# Patient Record
Sex: Female | Born: 1975 | Hispanic: Yes | Marital: Married | Smoking: Never smoker
Health system: Southern US, Community
[De-identification: ages and names within clinical notes are randomized; demographics above are authoritative.]

## PROBLEM LIST (undated history)

## (undated) DIAGNOSIS — E782 Mixed hyperlipidemia: Secondary | ICD-10-CM

## (undated) DIAGNOSIS — S83242A Other tear of medial meniscus, current injury, left knee, initial encounter: Secondary | ICD-10-CM

## (undated) DIAGNOSIS — E119 Type 2 diabetes mellitus without complications: Secondary | ICD-10-CM

## (undated) DIAGNOSIS — R1013 Epigastric pain: Secondary | ICD-10-CM

## (undated) DIAGNOSIS — I10 Essential (primary) hypertension: Secondary | ICD-10-CM

---

## 2017-10-08 ENCOUNTER — Other Ambulatory Visit: Payer: Self-pay

## 2017-10-08 ENCOUNTER — Emergency Department: Payer: Worker's Compensation

## 2017-10-08 ENCOUNTER — Emergency Department
Admission: EM | Admit: 2017-10-08 | Discharge: 2017-10-08 | Disposition: A | Discharge status: Home / Self Care | Payer: Worker's Compensation | Attending: Emergency Medicine | Admitting: Emergency Medicine

## 2017-10-08 ENCOUNTER — Encounter: Payer: Self-pay | Admitting: Emergency Medicine

## 2017-10-08 DIAGNOSIS — Y9289 Other specified places as the place of occurrence of the external cause: Secondary | ICD-10-CM | POA: Diagnosis not present

## 2017-10-08 DIAGNOSIS — S3992XA Unspecified injury of lower back, initial encounter: Secondary | ICD-10-CM | POA: Diagnosis present

## 2017-10-08 DIAGNOSIS — S300XXA Contusion of lower back and pelvis, initial encounter: Secondary | ICD-10-CM | POA: Diagnosis not present

## 2017-10-08 DIAGNOSIS — Y990 Civilian activity done for income or pay: Secondary | ICD-10-CM | POA: Insufficient documentation

## 2017-10-08 DIAGNOSIS — Y9389 Activity, other specified: Secondary | ICD-10-CM | POA: Insufficient documentation

## 2017-10-08 DIAGNOSIS — W228XXA Striking against or struck by other objects, initial encounter: Secondary | ICD-10-CM | POA: Diagnosis not present

## 2017-10-08 LAB — URINALYSIS, COMPLETE (UACMP) WITH MICROSCOPIC
BILIRUBIN URINE: NEGATIVE
Bacteria, UA: NONE SEEN
GLUCOSE, UA: NEGATIVE mg/dL
KETONES UR: NEGATIVE mg/dL
LEUKOCYTES UA: NEGATIVE
NITRITE: NEGATIVE
PH: 5 (ref 5.0–8.0)
Protein, ur: NEGATIVE mg/dL
SPECIFIC GRAVITY, URINE: 1.018 (ref 1.005–1.030)

## 2017-10-08 LAB — POCT PREGNANCY, URINE: PREG TEST UR: NEGATIVE

## 2017-10-08 MED ORDER — KETOROLAC TROMETHAMINE 30 MG/ML IJ SOLN
30.0000 mg | Freq: Once | INTRAMUSCULAR | Status: AC
  Administered 2017-10-08: 30 mg via INTRAMUSCULAR
  Filled 2017-10-08: qty 1

## 2017-10-08 MED ORDER — NAPROXEN 500 MG PO TABS: 500.0000 mg | ORAL_TABLET | Freq: Two times a day (BID) | ORAL | 0 refills | Status: DC

## 2017-10-08 NOTE — ED Provider Notes (Signed)
[facility] Emergency Department Provider Note  ___________________________________________   First MD Initiated Contact with Patient 10/08/17 [phone]     (approximate)  I have reviewed the triage vital signs and the nursing notes.   HISTORY Spanish interpreter [facility]  Chief Complaint Back Pain  HPI Vickie Walker is a 42 y.o. female is here with a back injury that occurred at work around noon on 10/07/17.  Patient states that she was working at a table when an aluminum radiator fell from behind her and struck her in the back.  This forced her abdomen into the table in front of her.  She was able to get out on her own and was not pinned in.  Patient has continued to have back pain especially when she is standing and walking.  She has taken Tylenol but the last dose being at 5 PM yesterday.  She denies any urinary symptoms.  She is complaining of some numbness in both legs.  She rates her pain as an 8 out of 10.   History reviewed. No pertinent past medical history.  There are no active problems to display for this patient.   History reviewed. No pertinent surgical history.  Prior to Admission medications   Medication Sig Start Date End Date Taking? Authorizing Provider  naproxen (NAPROSYN) 500 MG tablet Take 1 tablet (500 mg total) by mouth 2 (two) times daily with a meal. 10/08/17   Johnn Hai, PA-C    Allergies Patient has no known allergies.  No family history on file.  Social History Social History   Tobacco Use  . Smoking status: Not on file  Substance Use Topics  . Alcohol use: Not on file  . Drug use: Not on file    Review of Systems Constitutional: No fever/chills Cardiovascular: Denies chest pain. Respiratory: Denies shortness of breath. Gastrointestinal: No abdominal pain.  No nausea, no vomiting. Genitourinary: Negative for dysuria.  Negative for hematuria. Musculoskeletal: Positive for low back pain. Skin: Negative  for rash. Neurological: Negative for headaches, focal weakness or numbness. ____________________________________________   PHYSICAL EXAM:  VITAL SIGNS: ED Triage Vitals  Enc Vitals Group     BP 10/08/17 0700 (!) 155/102     Pulse Rate 10/08/17 0700 77     Resp 10/08/17 0700 18     Temp 10/08/17 0700 97.8 F (36.6 C)     Temp Source 10/08/17 0700 Oral     SpO2 10/08/17 0700 97 %     Weight 10/08/17 0649 148 lb (67.1 kg)     Height 10/08/17 0649 4' 5"  (1.346 m)     Head Circumference --      Peak Flow --      Pain Score 10/08/17 0648 8     Pain Loc --      Pain Edu? --      Excl. in [place]? --    Constitutional: Alert and oriented. Well appearing and in no acute distress. Eyes: Conjunctivae are normal.  Head: Atraumatic. Neck: No stridor.   Cardiovascular: Normal rate, regular rhythm. Grossly normal heart sounds.  Good peripheral circulation. Respiratory: Normal respiratory effort.  No retractions. Lungs CTAB. Gastrointestinal: Soft and nontender. No distention.  Musculoskeletal: On examination of the back there is some tenderness on palpation of the vertebral bodies L5-S1 area and paravertebral muscles surrounding.  There is no ecchymosis or abrasions seen from her injury.  Range of motion is minimally restricted as patient acted out the injury that occurred while  at work.  Patient also showed both myself, nurse and interpreter the amount of bending that she does not work.  Straight leg raises were negative.  Normal gait noted. Neurologic:  Normal speech and language. No gross focal neurologic deficits are appreciated.  Reflexes were 2+ bilaterally.  No gait instability. Skin:  Skin is warm, dry and intact.  No ecchymosis, abrasions, erythema present. Psychiatric: Mood and affect are normal. Speech and behavior are normal.  ____________________________________________   LABS (all labs ordered are listed, but only abnormal results are displayed)  Labs Reviewed  URINALYSIS,  COMPLETE (UACMP) WITH MICROSCOPIC - Abnormal; Notable for the following components:      Result Value   Color, Urine YELLOW (*)    APPearance HAZY (*)    Hgb urine dipstick SMALL (*)    Squamous Epithelial / LPF 0-5 (*)    All other components within normal limits  POCT PREGNANCY, URINE  POC URINE PREG, ED    RADIOLOGY  ED MD interpretation:   Lumbar spine is without acute changes.  Official radiology report(s): Dg Lumbar Spine 2-3 Views  Result Date: 10/08/2017 CLINICAL DATA:  Pain after trauma EXAM: LUMBAR SPINE - 2-3 VIEW COMPARISON:  None. FINDINGS: An IUD is seen in the pelvis.  Soft tissues otherwise unremarkable. No inter pedicular widening or significant malalignment. No evidence of fracture. Mild multilevel degenerative disc disease. IMPRESSION: No fractures identified. Electronically Signed   By: Dorise Bullion III M.D   On: 10/08/2017 07:58    ____________________________________________   PROCEDURES  Procedure(s) performed: None  Procedures  Critical Care performed: No  ____________________________________________   INITIAL IMPRESSION / ASSESSMENT AND PLAN / ED COURSE Interpreter was used to explain to the patient that most likely this is a contusion to her back and that x-ray findings did show some degenerative changes which the patient was unaware of.  Patient was given a injection of Toradol 30 mg IM.  She was discharged with a prescription for naproxen 500 mg twice daily with food.  Patient is to follow-up with company physician or to [facility] acute care if any continued problems.  Patient was given return to work note with multiple restrictions that she will return with to her company today.  ____________________________________________   FINAL CLINICAL IMPRESSION(S) / ED DIAGNOSES  Final diagnoses:  Contusion of lower back, initial encounter     ED Discharge Orders        Ordered    naproxen (NAPROSYN) 500 MG tablet  2 times daily with  meals     10/08/17 0824       Note:  This document was prepared using Dragon voice recognition software and may include unintentional dictation errors.    Johnn Hai, PA-C 10/08/17 1023    Schaevitz, Randall An, MD 10/08/17 1037

## 2017-10-08 NOTE — Discharge Instructions (Signed)
Follow up with your company's doctor or [facility] acute care if any continued problems with your back.  Take medication only as directed.

## 2017-10-08 NOTE — ED Notes (Signed)
Pt states she was at work standing at a table yesterday when a radiator on the table behind her fell off and struck her in the lower back causing her to push into the table in front of her. Pt c/o lower back pain. Pt ambulatory to ED41 from the [place] without difficulty. No noted eccymosis at present. No neuro deficits noted.

## 2017-10-08 NOTE — ED Triage Notes (Addendum)
[employer] [employer] [phone] pt reports while at work yesterday she was hit by an object that is an aluminum radiator. The object hit her in the lower back. Pt reports she was standing against a table and the object pushed her into the table so she is also having some lower abd pain from hitting the table. Pt denies difficulty urinating since and denies blood in her urine.

## 2019-05-15 ENCOUNTER — Other Ambulatory Visit: Payer: Self-pay

## 2019-05-15 ENCOUNTER — Ambulatory Visit: Payer: Self-pay

## 2019-05-15 VITALS — BP 110/76 | HR 71 | Resp 16 | Ht <= 58 in | Wt 137.0 lb

## 2019-05-15 DIAGNOSIS — Z008 Encounter for other general examination: Secondary | ICD-10-CM

## 2019-05-15 DIAGNOSIS — Z23 Encounter for immunization: Secondary | ICD-10-CM

## 2019-05-15 LAB — POCT LIPID PANEL
Glucose Fasting, POC: 134 mg/dL — AB (ref 70–99)
HDL: 42
LDL: 79
Non-HDL: 121
TC/HDL: 3.9
TC: 163
TRG: 212

## 2019-05-15 NOTE — Patient Instructions (Signed)
Vacuna antigripal (inactivada o recombinante): lo que debe saber Influenza (Flu) Vaccine (Inactivated or Recombinant): What You Need to Know 1. Por qu vacunarse? La vacuna contra la gripe puede prevenir la gripe. La gripe es una enfermedad contagiosa que se disemina en los [place] cada ao, por lo general, Lyndal Pulley octubre y Herman. Cualquier persona puede contraer gripe, pero es ms peligrosa para Education administrator. Los bebs y los nios pequeos, los Nordstrom de 30aos, las [place], as [employer] personas que tienen ciertas enfermedades o cuyo sistema inmunitario est debilitado corren un riesgo mayor de tener complicaciones debido a la gripe. La neumona, la bronquitis, las infecciones de los senos paranasales y las infecciones de odos son ejemplos de complicaciones relacionadas con la gripe. Si tiene una afeccin, por ejemplo, enfermedad cardaca, cncer o diabetes, la gripe puede empeorarla. La gripe puede causar fiebre y escalofros, dolor de garganta, dolores musculares, fatiga, tos, dolor de [place] y secrecin o congestin nasal. Algunas personas pueden tener vmitos y [place], [place] esto es ms frecuente en los nios que en los adultos. Cada ao, miles de [employer] [place] debido a la gripe, y muchas ms deben ser hospitalizadas. Cada ao, la vacuna antigripal previene millones de enfermedades y evita visitas al mdico relacionadas con la gripe. 2. Edward Jolly contra la gripe Los CDC (Centros para el Control y la Prevencin de [place]) recomiendan que todas las personas a Proofreader de los 6 meses de edad se vacunen cada temporada de gripe. Es posible que los nios de 43mses a 8aos deban recibir 2 dosis durante la misma temporada de gripe. Todas las dems personas tienen que aplicarse 1 sola dosis cada temporada de gripe. La vacuna comienza a surtir eAdministrator, Civil Service2semanas despus de su aplicacin. Hay muchos virus de la gripe, y eInformation systems manager  Cada ao, se elabora una nueva vacuna antigripal para brindar proteccin contra tres o cuatro virus que probablemente causen la enfermedad en la siguiente temporada de gripe. Incluso si la vacuna no es especfica para esos virus, aun as puede brindar cierta proteccin. La vacuna contra la gripe no causa gripe. La vacuna contra la gripe puede administrarse al mismo tiempo que otras vacunas. 3. Hable con el mdico Comunquese con la persona que le coloca las vacunas si la persona que la recibe:  Ha tenido una reaccin alrgica despus de uArdelia Memsdosis previa de la vacuna contra la gripe o tiene alguna alergia grave, potencialmente mortal.  Alguna vez tuvo sndrome de Guillain-Barr (tambin llamado SGB). En algunos casos, es posible que el mdico decida posponer la aplicacin de la vacuna contra la gripe para una visita en el futuro. Las personas que sufren trastornos menores, como un resfro, pueden vacunarse. Las pIllinois Tool Workstienen enfermedades moderadas o graves generalmente deben esperar hasta recuperarse para poder recibir la vacuna contra la gripe. Su mdico puede darle ms informacin. 4. Riesgos de uMexicoreaccin a la vacuna  Despus de recibir la vacuna contra la gripe, pAutomotive engineer enrojecimiento e hEstate agentde la inyeccin, fiebre, dolores musculares y dSocial research officer, governmentde cN[place]  Puede haber un pequeo aumento del riesgo de sufrir sndrome de GCurator(SGB) despus de la aplicacin de la vacuna contra la gripe inactivada. Los nios pequeos que reciben la vacuna antigripal junto con la vacuna antineumoccica (PCV13), o la DTaP en el mismo momento pueden tener una probabilidad un poco ms elevada de tener una convulsin debido a la fiebre. Informe al mdico si un nio que est recibiendo la  vacuna antigripal ha tenido una convulsin [employer]idental Petroleum. Las personas a veces se desmayan despus de procedimientos mdicos, incluida la vacunacin. Informe al mdico si se siente mareado, tiene  cambios en la visin o zumbidos en los odos. Al igual que con cualquier [employer] Company, existe una probabilidad muy remota de que una vacuna cause una reaccin alrgica grave, otra lesin grave o la muerte. 5. Qu pasa si se presenta un problema grave? Podra producirse una reaccin alrgica despus de que la persona vacunada abandone la clnica. Si observa signos de Nurse, mental health grave (ronchas, hinchazn de la cara y la garganta, dificultad para respirar, latidos cardacos acelerados, mareos o debilidad), llame al 9-1-1 y lleve a la persona al hospital ms cercano. Si se presentan otros signos que le preocupan, comunquese con su mdico. Las reacciones adversas deben informarse al Sistema de Informe de Eventos Adversos de Clinical biochemist (Vaccine Adverse Event Reporting System, VAERS). Por lo general, el mdico presenta este informe o puede hacerlo usted mismo. Visite el sitio web del VAERS en [email or web address].es o llame al [phone].El VAERS es solo para Electrical engineer; su personal no proporciona asesoramiento mdico. 6. Programa Nacional de Compensacin de Daos por [place] de Compensacin de Daos por Clinical biochemist (National Vaccine Injury Fiserv, Runner, broadcasting/film/video) es un programa federal que fue creado para Patent examiner a las personas que puedan haber sufrido daos al recibir ciertas vacunas. Visite el sitio web del VICP en GoldCloset.com.ee o llame al 1-581-456-7292 para obtener ms informacin acerca del programa y de cmo presentar un reclamo. Hay un lmite de tiempo para presentar un reclamo de compensacin. 7. Cmo puedo obtener ms informacin?  Pregntele al mdico.  Comunquese con el servicio de salud de su localidad o su estado.  Comunquese con los Centros para el Control y la Prevencin de Probation officer for Disease Control and Prevention, CDC): ? Llame al (719)[phone] (1-800-CDC-INFO) o ? Visite el sitio Biomedical engineer en https://gibson.com/  Declaracin de informacin (provisional) sobre la vacuna contra la gripe inactivada (15/03/2018) Esta informacin no tiene Marine scientist el consejo del mdico. Asegrese de hacerle al mdico cualquier pregunta que tenga. Document Released: 10/29/2008 Document Revised: 04/11/2018 Document Reviewed: 04/11/2018 Elsevier Patient Education  2020 [place] gripe en los adultos Preventing Influenza, Adult La gripe es una infeccin viral que afecta, principalmente, las vas respiratorias. Las vas respiratorias incluyen las estructuras que lo ayudan a Ambulance person, [employer], la nariz y Patent examiner. La gripe provoca muchos sntomas del resfro comn, as como fiebre alta y Hydrologist. Se transmite fcilmente de persona a persona (es contagiosa). La gripe es ms frecuente de diciembre a Administrator, arts. Este perodo se llama temporada de gripe. Puede contraer el virus de la gripe en las siguientes circunstancias:  Al aspirar las gotitas que una persona infectada elimina al toser o Brewing technologist.  Al tocar algo que fue recientemente contaminado con el virus y Dow Chemical mano a la boca, la nariz o los ojos. Qu puedo hacer para disminuir el riesgo?        Es posible International aid/development worker de contraer gripe tomando las siguientes medidas:  Vacunarse contra la gripe(vacunacin antigripal) todos los [place]. Esta es la mejor forma de prevenir la gripe. Se recomienda que todos las personas de 77mses o ms se vacunen contra la gripe. ? Es mejor vacunarse en el otoo, tan pronto como la vacuna est disponible. De todos modos, vacunarse durante el invierno o la primavera tambin es bueno. La  temporada de gripe puede durar hasta principios de la primavera. ? Para prevenir la gripe mediante vacunacin es necesario vacunarse todos los [place]. La razn es que el virus de la gripe cambia levemente(muta) de ao a ao. Aunque la vacuna antigripal no lo proteja completamente contra todas las  mutaciones del virus, puede disminuir la gravedad de la enfermedad y prevenir complicaciones peligrosas de la gripe. ? Si est embarazada, usted puede y debe aplicarse la vacuna contra la gripe. ? Si ha tenido Engineer, petroleum reaccin a Radiographer, therapeutic pasado o si es alrgico a los huevos, consulte al mdico antes de aplicarse la vacuna contra la gripe. ? Algunas veces, se puede conseguir la vacuna en la forma de aerosol nasal. Algunos aos el aerosol nasal no fue tan efectivo contra el virus de la gripe. Consulte con su mdico si tiene preguntas sobre esto.  Tener hbitos saludables. Esto es muy importante durante la temporada de gripe. ? Evite el contacto con personas que tengan sntomas de resfro o gripe. ? Lvese las manos frecuentemente con agua y [place]. Use desinfectante para manos con alcohol si no dispone de [place] y [place]. ? Evite tocarse la cara con las manos; sobre todo, cuando no se las haya lavado recientemente. ? Use un desinfectante para limpiar las superficies en el hogar y en el trabajo que pueden estar contaminadas con el virus de la gripe. ? Mantenga el sistema del cuerpo que lucha contra las enfermedades (sistema inmunitario) en buen estado siguiendo una dieta saludable, bebiendo mucho lquido, durmiendo lo suficiente y realizando actividad fsica con regularidad. Si contrae gripe, evite contagiar a otros haciendo lo siguiente:  No salga de su casa hasta que los sntomas hayan desaparecido, al menos, durante un da.  Al toser o estornudar, cbrase la boca y la [place].  Evite el contacto cercano con otras personas; especialmente, con bebs y Special educational needs teacher. Por qu son importantes estos cambios? La vacunacin contra la gripe y la prctica de hbitos saludables lo protegen a usted y a Lobbyist. Si se enferma de gripe, sus amigos, familiares y colegas tambin tienen riesgo de enfermarse porque se contagia muy fcilmente de unos a otros. Cada ao, alrededor Merck & Co de cada diez personas  contraen gripe. Es posible que la gripe cause complicaciones, como neumona, otitis y sinusitis. La gripe tambin puede ser mortal; especialmente, en el caso de los bebs, los [place] de 22aos y las personas con enfermedades crnicas graves. Cmo se trata? [place] personas se recuperan de la gripe haciendo reposo y bebiendo mucho lquido. Sin embargo, la prescripcin de un medicamento antiviral puede disminuir los sntomas de la gripe y Field seismologist que esta desaparezca antes. Se debe comenzar a tomar Coca[employer]a a los pocos das del inicio de los sntomas. Hable con su mdico acerca de si necesita tomar un medicamento antiviral. Pueden prescribirse medicamentos antivirales para las personas que tienen un riesgo alto de tener sntomas graves de gripe. Entre ellos se incluyen personas que:  [place] de 78 aos de edad.  Estn embarazadas.  Tienen alguna enfermedad que favorece a que la gripe empeore o sea ms peligrosa. Dnde buscar ms informacin  Centros para el control y la prevencin de Probation officer for Disease Control and Prevention, CDC): http://www.smith-bell.org/  LittleRockMedicine.com.ee: azureicus.com  [place] ([place] Academy of Family Physicians): Patent attorney.org/familydoctor/en/kids/vaccines/preventing-the-flu.html Comunquese con un mdico si:  Tiene gripe y le aparecen nuevos sntomas.  Tiene los siguientes sntomas: ? Tourist information centre manager. ?  Diarrea. ? Fiebre.  La tos empeora o tiene ms mucosidad. Resumen  La mejor manera de prevenir la gripe es vacunarse todos los aos en otoo.  Puede contraer la gripe despus de haberse colocado la vacuna anual, pero posiblemente esta sea ms leve o se cure ms rpidamente gracias a la vacuna antigripal.  Si contrae gripe, es posible aliviar los sntomas y curarse ms rpidamente si comienza a Geophysical data processor antivirales a los N[employer] Corporation del inicio de los sntomas.   Tambin puede prevenir la gripe teniendo hbitos saludables. Esta informacin no tiene Marine scientist el consejo del mdico. Asegrese de hacerle al mdico cualquier pregunta que tenga. Document Released: 08/17/2015 Document Revised: 11/12/2017 Document Reviewed: 08/17/2015 Elsevier Patient Education  2020 [employer].

## 2019-05-15 NOTE — Progress Notes (Signed)
     Patient ID: Vickie Walker, female    DOB: Aug 14, 1976, 43 y.o.   MRN: [id number]    Thank you!!  Apolonio Schneiders RN  [place] Nurse Specialist [place]: [phone]  Cell:  [phone] Website: [email or web address]

## 2019-10-30 ENCOUNTER — Emergency Department: Payer: No Typology Code available for payment source

## 2019-10-30 ENCOUNTER — Other Ambulatory Visit: Payer: Self-pay

## 2019-10-30 ENCOUNTER — Emergency Department
Admission: EM | Admit: 2019-10-30 | Discharge: 2019-10-30 | Disposition: A | Discharge status: Home / Self Care | Payer: No Typology Code available for payment source | Attending: Emergency Medicine | Admitting: Emergency Medicine

## 2019-10-30 DIAGNOSIS — M25511 Pain in right shoulder: Secondary | ICD-10-CM

## 2019-10-30 DIAGNOSIS — Z79899 Other long term (current) drug therapy: Secondary | ICD-10-CM | POA: Insufficient documentation

## 2019-10-30 DIAGNOSIS — R519 Headache, unspecified: Secondary | ICD-10-CM | POA: Diagnosis present

## 2019-10-30 DIAGNOSIS — M545 Low back pain, unspecified: Secondary | ICD-10-CM

## 2019-10-30 DIAGNOSIS — E119 Type 2 diabetes mellitus without complications: Secondary | ICD-10-CM | POA: Diagnosis not present

## 2019-10-30 HISTORY — DX: Type 2 diabetes mellitus without complications: E11.9

## 2019-10-30 LAB — BASIC METABOLIC PANEL
Anion gap: 11 (ref 5–15)
BUN: 14 mg/dL (ref 6–20)
CO2: 23 mmol/L (ref 22–32)
Calcium: 8.9 mg/dL (ref 8.9–10.3)
Chloride: 104 mmol/L (ref 98–111)
Creatinine, Ser: 0.4 mg/dL — ABNORMAL LOW (ref 0.44–1.00)
GFR calc Af Amer: >60 mL/min (ref >60)
GFR calc non Af Amer: >60 mL/min (ref >60)
Glucose, Bld: 164 mg/dL — ABNORMAL HIGH (ref 70–99)
Potassium: 3.9 mmol/L (ref 3.5–5.1)
Sodium: 138 mmol/L (ref 135–145)

## 2019-10-30 LAB — CBC
HCT: 40.1 % (ref 36.0–46.0)
Hemoglobin: 14.1 g/dL (ref 12.0–15.0)
MCH: 31 pg (ref 26.0–34.0)
MCHC: 35.2 g/dL (ref 30.0–36.0)
MCV: 88.1 fL (ref 80.0–100.0)
Platelets: 243 10*3/uL (ref 150–400)
RBC: 4.55 MIL/uL (ref 3.87–5.11)
RDW: 12.4 % (ref 11.5–15.5)
WBC: 7.6 10*3/uL (ref 4.0–10.5)
nRBC: 0 % (ref 0.0–0.2)

## 2019-10-30 LAB — POCT PREGNANCY, URINE: Preg Test, Ur: NEGATIVE

## 2019-10-30 MED ORDER — BACLOFEN 5 MG PO TABS: 5.0000 mg | ORAL_TABLET | Freq: Three times a day (TID) | ORAL | 0 refills | Status: AC | PRN

## 2019-10-30 MED ORDER — ACETAMINOPHEN 325 MG PO TABS
650.0000 mg | ORAL_TABLET | Freq: Once | ORAL | Status: AC
  Administered 2019-10-30: 650 mg via ORAL
  Filled 2019-10-30: qty 2
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
## 2020-08-26 ENCOUNTER — Other Ambulatory Visit: Payer: Self-pay | Admitting: Sports Medicine

## 2020-08-26 DIAGNOSIS — G8929 Other chronic pain: Secondary | ICD-10-CM

## 2020-08-26 DIAGNOSIS — M25562 Pain in left knee: Secondary | ICD-10-CM

## 2020-08-26 DIAGNOSIS — M25862 Other specified joint disorders, left knee: Secondary | ICD-10-CM

## 2020-09-08 ENCOUNTER — Other Ambulatory Visit: Payer: Self-pay

## 2020-09-08 ENCOUNTER — Ambulatory Visit
Admission: RE | Admit: 2020-09-08 | Discharge: 2020-09-08 | Disposition: A | Discharge status: Home / Self Care | Payer: BC Managed Care – PPO | Source: Ambulatory Visit | Attending: Sports Medicine | Admitting: Sports Medicine

## 2020-09-08 DIAGNOSIS — M25862 Other specified joint disorders, left knee: Secondary | ICD-10-CM | POA: Insufficient documentation

## 2020-09-08 DIAGNOSIS — G8929 Other chronic pain: Secondary | ICD-10-CM | POA: Insufficient documentation

## 2020-09-08 DIAGNOSIS — M25562 Pain in left knee: Secondary | ICD-10-CM | POA: Insufficient documentation

## 2020-11-04 ENCOUNTER — Emergency Department: Payer: BC Managed Care – PPO

## 2020-11-04 ENCOUNTER — Emergency Department
Admission: EM | Admit: 2020-11-04 | Discharge: 2020-11-04 | Disposition: A | Discharge status: Home / Self Care | Payer: BC Managed Care – PPO | Attending: Emergency Medicine | Admitting: Emergency Medicine

## 2020-11-04 ENCOUNTER — Other Ambulatory Visit: Payer: Self-pay

## 2020-11-04 DIAGNOSIS — Y92410 Unspecified street and highway as the place of occurrence of the external cause: Secondary | ICD-10-CM | POA: Diagnosis not present

## 2020-11-04 DIAGNOSIS — I10 Essential (primary) hypertension: Secondary | ICD-10-CM | POA: Insufficient documentation

## 2020-11-04 DIAGNOSIS — E119 Type 2 diabetes mellitus without complications: Secondary | ICD-10-CM | POA: Insufficient documentation

## 2020-11-04 DIAGNOSIS — M542 Cervicalgia: Secondary | ICD-10-CM | POA: Insufficient documentation

## 2020-11-04 DIAGNOSIS — M79642 Pain in left hand: Secondary | ICD-10-CM | POA: Insufficient documentation

## 2020-11-04 HISTORY — DX: Essential (primary) hypertension: I10

## 2020-11-04 LAB — POC URINE PREG, ED: Preg Test, Ur: NEGATIVE

## 2020-11-04 MED ORDER — IBUPROFEN 600 MG PO TABS
600.0000 mg | ORAL_TABLET | Freq: Once | ORAL | Status: AC
  Administered 2020-11-04: 600 mg via ORAL
  Filled 2020-11-04: qty 1
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
## 2020-11-04 NOTE — ED Notes (Addendum)
XR asked to come back due to patient c/o multiple pain sites and may need more imaging. Dr Beather Arbour aware.  Language line obtained and at bedside.
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
## 2020-12-25 ENCOUNTER — Other Ambulatory Visit: Payer: Self-pay | Admitting: Orthopedic Surgery

## 2020-12-25 DIAGNOSIS — M23204 Derangement of unspecified medial meniscus due to old tear or injury, left knee: Secondary | ICD-10-CM

## 2020-12-26 ENCOUNTER — Other Ambulatory Visit: Payer: Self-pay | Admitting: Orthopedic Surgery

## 2020-12-26 ENCOUNTER — Encounter: Payer: Self-pay | Admitting: Orthopedic Surgery

## 2020-12-26 ENCOUNTER — Ambulatory Visit: Payer: BC Managed Care – PPO

## 2020-12-30 ENCOUNTER — Encounter: Payer: Self-pay | Admitting: Orthopedic Surgery

## 2020-12-30 ENCOUNTER — Ambulatory Visit: Payer: BC Managed Care – PPO

## 2020-12-30 ENCOUNTER — Other Ambulatory Visit: Payer: Self-pay

## 2020-12-30 ENCOUNTER — Encounter
Admission: RE | Disposition: A | Discharge status: Home / Self Care | Payer: Self-pay | Source: Home / Self Care | Attending: Orthopedic Surgery

## 2020-12-30 ENCOUNTER — Ambulatory Visit: Payer: BC Managed Care – PPO | Admitting: Anesthesiology

## 2020-12-30 ENCOUNTER — Ambulatory Visit
Admission: RE | Admit: 2020-12-30 | Discharge: 2020-12-30 | Disposition: A | Discharge status: Home / Self Care | Payer: BC Managed Care – PPO | Attending: Orthopedic Surgery | Admitting: Orthopedic Surgery

## 2020-12-30 DIAGNOSIS — S83242A Other tear of medial meniscus, current injury, left knee, initial encounter: Secondary | ICD-10-CM | POA: Insufficient documentation

## 2020-12-30 DIAGNOSIS — Y939 Activity, unspecified: Secondary | ICD-10-CM | POA: Insufficient documentation

## 2020-12-30 DIAGNOSIS — X58XXXA Exposure to other specified factors, initial encounter: Secondary | ICD-10-CM | POA: Insufficient documentation

## 2020-12-30 DIAGNOSIS — Z79899 Other long term (current) drug therapy: Secondary | ICD-10-CM | POA: Insufficient documentation

## 2020-12-30 DIAGNOSIS — Z7984 Long term (current) use of oral hypoglycemic drugs: Secondary | ICD-10-CM | POA: Diagnosis not present

## 2020-12-30 DIAGNOSIS — Z7983 Long term (current) use of bisphosphonates: Secondary | ICD-10-CM | POA: Insufficient documentation

## 2020-12-30 DIAGNOSIS — M1712 Unilateral primary osteoarthritis, left knee: Secondary | ICD-10-CM | POA: Diagnosis not present

## 2020-12-30 DIAGNOSIS — Z419 Encounter for procedure for purposes other than remedying health state, unspecified: Secondary | ICD-10-CM

## 2020-12-30 HISTORY — PX: KNEE ARTHROSCOPY: SHX127

## 2020-12-30 HISTORY — DX: Mixed hyperlipidemia: E78.2

## 2020-12-30 HISTORY — DX: Other tear of medial meniscus, current injury, left knee, initial encounter: S83.242A

## 2020-12-30 HISTORY — DX: Epigastric pain: R10.13

## 2020-12-30 LAB — GLUCOSE, CAPILLARY
Glucose-Capillary: 114 mg/dL — ABNORMAL HIGH (ref 70–99)
Glucose-Capillary: 120 mg/dL — ABNORMAL HIGH (ref 70–99)

## 2020-12-30 LAB — POCT PREGNANCY, URINE: Preg Test, Ur: NEGATIVE

## 2020-12-30 SURGERY — ARTHROSCOPY, KNEE
Anesthesia: General | Site: Knee | Laterality: Left

## 2020-12-30 MED ORDER — HYDROCODONE-ACETAMINOPHEN 5-325 MG PO TABS: 1.0000 | ORAL_TABLET | ORAL | 0 refills | Status: AC | PRN

## 2020-12-30 MED ORDER — MIDAZOLAM HCL 5 MG/5ML IJ SOLN
INTRAMUSCULAR | Status: DC | PRN
  Administered 2020-12-30: 2 mg via INTRAVENOUS

## 2020-12-30 MED ORDER — CEFAZOLIN SODIUM-DEXTROSE 2-4 GM/100ML-% IV SOLN
2.0000 g | INTRAVENOUS | Status: AC
  Administered 2020-12-30: 2 g via INTRAVENOUS

## 2020-12-30 MED ORDER — ONDANSETRON HCL 4 MG/2ML IJ SOLN
4.0000 mg | Freq: Once | INTRAMUSCULAR | Status: AC | PRN
  Administered 2020-12-30: 4 mg via INTRAVENOUS

## 2020-12-30 MED ORDER — GLYCOPYRROLATE 0.2 MG/ML IJ SOLN
INTRAMUSCULAR | Status: DC | PRN
  Administered 2020-12-30: .1 mg via INTRAVENOUS

## 2020-12-30 MED ORDER — ONDANSETRON 4 MG PO TBDP: 4.0000 mg | ORAL_TABLET | Freq: Three times a day (TID) | ORAL | 0 refills | Status: AC | PRN

## 2020-12-30 MED ORDER — HYDROMORPHONE HCL 1 MG/ML IJ SOLN
0.2500 mg | INTRAMUSCULAR | Status: DC | PRN
  Administered 2020-12-30 (×2): 0.25 mg via INTRAVENOUS

## 2020-12-30 MED ORDER — ACETAMINOPHEN 160 MG/5ML PO SOLN: 325.0000 mg | ORAL | Status: DC | PRN

## 2020-12-30 MED ORDER — DEXAMETHASONE SODIUM PHOSPHATE 4 MG/ML IJ SOLN
INTRAMUSCULAR | Status: DC | PRN
  Administered 2020-12-30: 4 mg via INTRAVENOUS

## 2020-12-30 MED ORDER — OXYCODONE HCL 5 MG/5ML PO SOLN
5.0000 mg | Freq: Once | ORAL | Status: AC | PRN
Start: 2020-12-30 — End: 2020-12-30

## 2020-12-30 MED ORDER — PROPOFOL 10 MG/ML IV BOLUS
INTRAVENOUS | Status: DC | PRN
  Administered 2020-12-30: 120 mg via INTRAVENOUS

## 2020-12-30 MED ORDER — LIDOCAINE-EPINEPHRINE 1 %-1:100000 IJ SOLN
INTRAMUSCULAR | Status: DC | PRN
  Administered 2020-12-30: 15 mL via INTRAMUSCULAR

## 2020-12-30 MED ORDER — LIDOCAINE HCL (CARDIAC) PF 100 MG/5ML IV SOSY
PREFILLED_SYRINGE | INTRAVENOUS | Status: DC | PRN
  Administered 2020-12-30: 40 mg via INTRATRACHEAL

## 2020-12-30 MED ORDER — OXYCODONE HCL 5 MG PO TABS
5.0000 mg | ORAL_TABLET | Freq: Once | ORAL | Status: AC | PRN
  Administered 2020-12-30: 5 mg via ORAL

## 2020-12-30 MED ORDER — ASPIRIN EC 325 MG PO TBEC: 325.0000 mg | DELAYED_RELEASE_TABLET | Freq: Every day | ORAL | 0 refills | Status: AC

## 2020-12-30 MED ORDER — LACTATED RINGERS IV SOLN: INTRAVENOUS | Status: DC

## 2020-12-30 MED ORDER — FENTANYL CITRATE (PF) 100 MCG/2ML IJ SOLN
INTRAMUSCULAR | Status: DC | PRN
  Administered 2020-12-30 (×5): 25 ug via INTRAVENOUS
  Administered 2020-12-30: 50 ug via INTRAVENOUS
  Administered 2020-12-30: 25 ug via INTRAVENOUS

## 2020-12-30 MED ORDER — ACETAMINOPHEN 325 MG PO TABS: 325.0000 mg | ORAL_TABLET | ORAL | Status: DC | PRN

## 2020-12-30 MED ORDER — IBUPROFEN 800 MG PO TABS: 800.0000 mg | ORAL_TABLET | Freq: Three times a day (TID) | ORAL | 1 refills | Status: AC

## 2020-12-30 MED ORDER — ACETAMINOPHEN 500 MG PO TABS: 1000.0000 mg | ORAL_TABLET | Freq: Three times a day (TID) | ORAL | 2 refills | Status: AC

## 2020-12-30 SURGICAL SUPPLY — 51 items
ADAPTER IRRIG TUBE 2 SPIKE SOL (ADAPTER) ×4 IMPLANT
ADH SKN CLS APL DERMABOND .7 (GAUZE/BANDAGES/DRESSINGS) ×1
ADPR TBG 2 SPK PMP STRL ASCP (ADAPTER) ×2
APL PRP STRL LF DISP 70% ISPRP (MISCELLANEOUS) ×1
BLADE SURG SZ11 CARB STEEL (BLADE) ×2 IMPLANT
BNDG COHESIVE 4X5 TAN STRL (GAUZE/BANDAGES/DRESSINGS) ×2 IMPLANT
BNDG ESMARK 6X12 TAN STRL LF (GAUZE/BANDAGES/DRESSINGS) ×2 IMPLANT
BRACE T-SCOPE KNEE POSTOP (MISCELLANEOUS) ×1 IMPLANT
CHLORAPREP W/TINT 26 (MISCELLANEOUS) ×2 IMPLANT
COOLER POLAR GLACIER W/PUMP (MISCELLANEOUS) ×2 IMPLANT
COVER LIGHT HANDLE UNIVERSAL (MISCELLANEOUS) ×4 IMPLANT
CUFF TOURN SGL QUICK 24 (TOURNIQUET CUFF) ×2
CUFF TRNQT CYL 24X4X16.5-23 (TOURNIQUET CUFF) IMPLANT
DECANTER SPIKE VIAL GLASS SM (MISCELLANEOUS) ×3 IMPLANT
DERMABOND ADVANCED (GAUZE/BANDAGES/DRESSINGS) ×1
DERMABOND ADVANCED .7 DNX12 (GAUZE/BANDAGES/DRESSINGS) IMPLANT
DRAPE IMP U-DRAPE 54X76 (DRAPES) ×2 IMPLANT
GAUZE SPONGE 4X4 12PLY STRL (GAUZE/BANDAGES/DRESSINGS) ×2 IMPLANT
GLOVE BIOGEL M 8.0 STRL (GLOVE) ×1 IMPLANT
GLOVE SURG ENC MOIS LTX SZ7.5 (GLOVE) ×2 IMPLANT
GOWN STRL REUS W/TWL LRG LVL3 (GOWN DISPOSABLE) ×2 IMPLANT
GOWN STRL REUS W/TWL LRG LVL4 (GOWN DISPOSABLE) ×1 IMPLANT
GRAFT FILLER BONE 5ML (Knees) IMPLANT
IV LACTATED RINGER IRRG 3000ML (IV SOLUTION) ×14
IV LR IRRIG 3000ML ARTHROMATIC (IV SOLUTION) ×4 IMPLANT
KIT ACCUFILL 5CC (Knees) ×1 IMPLANT
KIT KNEE SCP 414.502 (Knees) ×2 IMPLANT
KIT ROOT REPAIR MEINISCAL PEEK (Anchor) IMPLANT
KIT TURNOVER KIT A (KITS) ×2 IMPLANT
MANIFOLD NEPTUNE II (INSTRUMENTS) ×2 IMPLANT
MAT ABSORB  FLUID 56X50 GRAY (MISCELLANEOUS) ×2
MAT ABSORB FLUID 56X50 GRAY (MISCELLANEOUS) ×1 IMPLANT
MEINISCAL ROOT REPAIR KIT PEEK (Anchor) ×2 IMPLANT
PACK ARTHROSCOPY KNEE (MISCELLANEOUS) ×2 IMPLANT
PAD ABD DERMACEA PRESS 5X9 (GAUZE/BANDAGES/DRESSINGS) ×3 IMPLANT
PAD WRAPON POLAR KNEE (MISCELLANEOUS) ×1 IMPLANT
PENCIL SMOKE EVACUATOR (MISCELLANEOUS) ×1 IMPLANT
SET TUBE SUCT SHAVER OUTFL 24K (TUBING) ×2 IMPLANT
SUT 0 TIGERLINK 1.5 FWIRE CLS (SUTURE) ×2
SUT ETHILON 3-0 FS-10 30 BLK (SUTURE) ×2
SUT MNCRL 4-0 (SUTURE) ×2
SUT MNCRL 4-0 27XMFL (SUTURE) ×1
SUT VIC AB 2-0 SH 27 (SUTURE) ×2
SUT VIC AB 2-0 SH 27XBRD (SUTURE) IMPLANT
SUTURE 0 TIGRLNK 1.5 FWIR CLS (SUTURE) IMPLANT
SUTURE EHLN 3-0 FS-10 30 BLK (SUTURE) ×1 IMPLANT
SUTURE MNCRL 4-0 27XMF (SUTURE) IMPLANT
TOWEL OR 17X26 4PK STRL BLUE (TOWEL DISPOSABLE) ×4 IMPLANT
TUBING ARTHRO INFLOW-ONLY STRL (TUBING) ×2 IMPLANT
WAND WEREWOLF FLOW 90D (MISCELLANEOUS) ×2 IMPLANT
WRAPON POLAR PAD KNEE (MISCELLANEOUS) ×2

## 2020-12-30 NOTE — Transfer of Care (Signed)
Immediate Anesthesia Transfer of Care Note  Patient: Vickie Walker  Procedure(s) Performed: Left knee arthroscopic meniscus root repair with subchondroplasty to the medial tibial plateau (Left Knee)  Patient Location: PACU  Anesthesia Type: General  Level of Consciousness: awake, alert  and patient cooperative  Airway and Oxygen Therapy: Patient Spontanous Breathing and Patient connected to supplemental oxygen  Post-op Assessment: Post-op Vital signs reviewed, Patient's Cardiovascular Status Stable, Respiratory Function Stable, Patent Airway and No signs of Nausea or vomiting  Post-op Vital Signs: Reviewed and stable  Complications: No complications documented.

## 2020-12-30 NOTE — Anesthesia Postprocedure Evaluation (Signed)
Anesthesia Post Note  Patient: Vickie Walker  Procedure(s) Performed: Left knee arthroscopic meniscus root repair with subchondroplasty to the medial tibial plateau (Left Knee)     Patient location during evaluation: PACU Anesthesia Type: General Level of consciousness: awake and alert Pain management: pain level controlled Vital Signs Assessment: post-procedure vital signs reviewed and stable Respiratory status: spontaneous breathing, nonlabored ventilation, respiratory function stable and patient connected to nasal cannula oxygen Cardiovascular status: blood pressure returned to baseline and stable Postop Assessment: no apparent nausea or vomiting Anesthetic complications: no   No complications documented.  Trecia Rogers

## 2020-12-30 NOTE — Op Note (Addendum)
DATE: 12/30/2020   PRE-OP DIAGNOSIS:  1. Left medial meniscus root tear 2. Left knee subchondral insufficiency fracture of medial tibial plateau   POST-OP DIAGNOSIS:  1. Left medial meniscus root tear 2. Left knee patellofemoral and Hoffa's pad synovitis 3. Left medial tibial plateau subchondral insufficiency fracture 4.  Left knee mild degenerative changes to the medial and patellofemoral compartment  PROCEDURES:  1. Left knee medial meniscus root repair  2.  Left knee arthroscopic partial synovectomy 3. Left knee arthroscopic chondroplasty of the patella 4.  Left knee arthroscopically assisted subchondroplasty of medial tibial plateau (treatment of medial tibial plateau fracture)   SURGEON:  Langston Reusing, MD   ASSISTANT(S): None   ANESTHESIA: Gen   TOTAL IV FLUIDS: See anesthesia record   ESTIMATED BLOOD LOSS: 10cc   TOURNIQUET TIME:  68 min   DRAINS:  None.   SPECIMENS: None   IMPLANTS:  - Arthrex Biocomposite SwiveLock (x1)     COMPLICATIONS: None   INDICATIONS: Vickie Walker is a 45 y.o. female who has had knee pain for over 1 year.  She has undergone extensive nonoperative management without improvement in her symptoms. Clinical exam and radiographic studies were notable for left knee pain and swelling with instances of giving way with associated popping and catching on the medial aspect of her knee. Additionally, MRI showed a medial meniscus root tear with mild degenerative changes to medial and patellofemoral compartments as well as subchondral insufficiency fracture to the tibial plateau with associated bone marrow edema. Given the failure of nonoperative management and poor long-term prognosis of a meniscus root tear and high likelihood of significant progression of osteoarthritis, we elected to proceed with the above procedure after a discussion of the risks, benefits, and alternatives to surgery.    OPERATIVE FINDINGS:    Examination under  anesthesia: A careful examination under anesthesia was performed.  Passive range of motion was: Hyperextension: 0.  Extension: 0.  Flexion: 130.  Lachman: normal. Pivot Shift: normal.  Posterior drawer: normal.  Varus stability in full extension: normal.  Varus stability in 30 degrees of flexion: normal.  Valgus stability in full extension: normal.  Valgus stability in 30 degrees of flexion: normal.   Intra-operative findings: A thorough arthroscopic examination of the knee was performed.  The findings are: 1. Suprapatellar pouch: Significant synovitis 2. Undersurface of median ridge: Focal area of grade 2 degenerative changes measuring approximately 8 x 6 mm 3. Medial patellar facet: Grade 1 degenerative changes 4. Lateral patellar facet: Grade 1 degenerative changes 5. Trochlea: Normal 6. Lateral gutter/popliteus tendon: Normal 7. Hoffa's fat pad: Significant synovitis 8. Medial gutter/plica: Normal 9. ACL: Normal 10. PCL: Normal 11. Medial meniscus: Tear of the medial meniscus at the posterior root and undersurface horizontal/oblique tear of the posterior horn affecting approximately 40% of the meniscus witdh 12. Medial compartment cartilage: Focal areas of Grade 2 degenerative changes on MFC, diffuse grade 1-2 changes on tibial plateau 13. Lateral meniscus: Normal 14. Lateral compartment cartilage: Grade 1 changes to femoral condyle, normal tibial plateau  DESCRIPTION OF PROCEDURE: I identified [name] in the pre-operative holding area.  I marked the operative knee with my initials. I reviewed the risks and benefits of the proposed surgical intervention and the patient (and/or patient's guardian) wished to proceed. The patient was transferred to the operative suite and placed in the supine position with all bony prominences padded.  Anesthesia was administered. Appropriate IV antibiotics were administered prior to incision. The extremity was then  prepped and draped in standard  fashion. A time out was performed confirming the correct extremity, correct patient, and correct procedure.   Subchondroplasty to treat the medial tibial plateau subchondral insufficiency fracture was performed first.  Using fluoroscopic guidance and correlation with the patient's MRI, a small stab incision was made over the medial tibial plateau.  A trocar with cannula was drilled into the site of the subchondral insufficiency fracture such that all of the flutes were within bone.  Calcium phosphate mixture was appropriately mixed and 3cc of calcium phosphate were injected through the cannula into the medial tibial plateau subchondral insufficiency fracture.  Appropriate filling was seen on fluoroscopy.  Cannula was left in place for approximately 8 minutes until calcium phosphate had appropriately set.   Arthroscopy portals were marked. Local anesthetic was injected to the planned portal sites. The anterolateral portal was established with an 11 blade. The arthroscope was placed in the anterolateral portal and then into the suprapatellar pouch.  A diagnostic knee scope was completed with the above findings. Next the medial portal was established under needle localization.  Significant synovitis in the patellofemoral space and about Hoffa's fat pad was debrided using oscillating shaver and ArthroCare wand.  Chondroplasty of the patella was performed with an oscillating shaver such that there were stable cartilage edges.  The MCL was pie-crusted to improve visualization of the posterior horn of the medial meniscus. The meniscus root tear was identified and probed to confirm our findings.  An oscillating shaver and meniscus biter were used to debride the tear of the posterior horn that was in a horizontal/oblique fashion affecting the undersurface. The tibial stump of the meniscus was then debrided.  Next, an Arthrex meniscus root aiming guide was used to mark out the tibial incision. An approximately 3cm  vertical incision was made medial to the tibial tubercle. This was carried down to the sartorius fascia with bovie electrocautery, and the fascia was incised. An elevator was used to clear periosteum from the tibia in the area of the anticipated bone tunnel. Hemostasis was achieved. The guide was reinserted into the tibia and was placed over the anatomic footprint of the medial meniscus root. We then used a 6.43m FlipCutter to drill into the tibia and create a 639msocket for the meniscus root. A FiberStick was passed through our tibial tunnel and into the joint. This was retrieved and passed through the anterolateral portal.   Next, using a Meniscus Scorpion, an 0-FiberLink suture was passed just medial to the meniscus root in a luggage tag configuration.  A second stitch was passed in a similar fashion medial to the first stitch.  This allowed for excellent purchase of the meniscus root.  We ensured there was no soft tissue bridge between the sutures and pulled the passing stitch from the [place] the anteromedial portal. We then used the passing stitch to bring the sutures in the meniscus out through the tibial tunnel. These were then passed through an [employer]. Anchor hole was drilled ~2cm distal to the previously drilled tibial tunnel. Anchor was inserted with appropriate tension while visualizing the repair with the arthroscope, and this achieved excellent interference fit. The meniscus root was probed and found to be stable.   Any loose bony debris was removed from the knee joint with a shaver and excess fluid was evacuated from the joint. Closure of the portals and medial tibial plateau incision.  Chondroplasty was performed using 3-0 nylon. The sartorius fascia was closed 0 vicryl.  The subdermal layer of the tibial incision was closed with 2-0 vicryl, and the skin was closed with 4-0 Monocryl in a running fashion and Dermabond.  Xeroform gauze and dry sterile dressings were  applied. A PolarCare and hinged knee brace were also applied.   Instrument, sponge, and needle counts were correct prior to wound closure and at the conclusion of the case.   Additionally, this case had increased complexity compared to standard meniscus repair given that the patient had a complete tear of the meniscus root. Repair of this tear involved making a separate open incision, drilling a tunnel through the tibia, and using an implant in the tibia for fixation, all of which would otherwise not occur for standard meniscal repairs.  The steps increased surgical time by approximately 30 minutes.  DISPOSITION: PACU - hemodynamically stable.    POSTOPERATIVE PLAN: The patient will be discharged home today.     Non-weight bearing x 4 weeks. 50% WB from weeks 4-6. ASA for DVT ppx x 4 weeks, Narcotic medication, NSAID, and acetaminophen as discussed pre-operatively. The patient will be attending physical therapy beginning 3-4 days post-op. Physical therapy per Meniscus Root Repair Rehab Guidelines.   Patient to return to clinic 10-14 days postop for suture removal.

## 2020-12-30 NOTE — Discharge Instructions (Signed)
Arthroscopic Knee Surgery - Meniscus Repair   Post-Op Instructions   1. Bracing or crutches: Crutches will be provided at the time of discharge from the surgery center. Keep brace locked in extension at all times except as directed by physical therapy.    2. Ice: You may be provided with a device [facility]) that allows you to ice the affected area effectively. Otherwise you can ice manually.    3. Driving:  Plan on not driving for at least four weeks. Please note that you are advised NOT to drive while taking narcotic pain medications as you may be impaired and unsafe to drive.   4. Activity: Ankle pumps several times an hour while awake to prevent blood clots. Weight bearing: NO WEIGHT BEARING FOR 4 WEEKS. Use crutches for at least 4 weeks, if not 6 based on your surgery. Bending and straightening the knee is unlimited, but do not flex your knee past 90 degrees until cleared by your therapist. Elevate knee above heart level as much as possible for one week. Avoid standing more than 5 minutes (consecutively) for the first week. No exercise involving the knee until cleared by the surgeon or physical therapist.  Avoid long distance travel for 4 weeks.   5. Medications:  - You have been provided a prescription for narcotic pain medicine. After surgery, take 1-2 narcotic tablets every 4 hours if needed for severe pain. If it has tylenol (acetaminophen), please do not take a total of more than 3016m/day of tylenol.  - A prescription for anti-nausea medication will be provided in case the narcotic medicine causes nausea - take 1 tablet every 6 hours only if nauseated.  - Take ibuprofen 800 mg every 8 hours with food to reduce post-operative knee swelling. DO NOT STOP IBUPROFEN POST-OP UNTIL INSTRUCTED TO DO SO at first post-op office visit (10-14 days after surgery).  - Take enteric coated aspirin 325 mg once daily for 4 weeks to prevent blood clots.  -Take tylenol 1000 every 8 hours for pain.  May  stop tylenol 3 days after surgery or when you are having minimal pain. If your narcotic has tylenol (acetaminophen), please do not take a total of more than 30016mday of tylenol.    If you are taking prescription medication for anxiety, depression, insomnia, muscle spasm, chronic pain, or for attention deficit disorder you are advised that you are at a higher risk of adverse effects with use of narcotics post-op, including narcotic addiction/dependence, depressed breathing, death. If you use non-prescribed substances: alcohol, marijuana, cocaine, heroin, methamphetamines, etc., you are at a higher risk of adverse effects with use of narcotics post-op, including narcotic addiction/dependence, depressed breathing, death. You are advised that taking > 50 morphine milligram equivalents (MME) of narcotic pain medication per day results in twice the risk of overdose or death. For your prescription provided: oxycodone 5 mg - taking more than 6 tablets per day. Be advised that we will prescribe narcotics short-term, for acute post-operative pain only - 1 week for minor operations such as knee arthroscopy for meniscus tear resection, and 3 weeks for major operations such as knee repair/reconstruction surgeries.   6. Bandages: The physical therapist should change the bandages at the first post-op appointment. If needed, the dressing supplies have been provided to you. You may shower after this with waterproof bandaids covering the incisions.    7. Physical Therapy: 2 times per week for the first 4 weeks, then 1-2 times per week from weeks 4-8 post-op. Therapy typically  starts on post operative Day 3 or 4. You have been provided an order for physical therapy. The therapist will provide home exercises.   8. Work: May return to full work when off of crutches. May do light duty/desk job in approximately 1-2 weeks when off of narcotics, pain is well-controlled, and swelling has decreased.   9. Post-Op  Appointments: Your first post-op appointment will be with Dr. Posey Pronto in approximately 2 weeks time.    If you find that they have not been scheduled please call the Orthopaedic Appointment front desk at [phone].      General Anesthesia, Adult, Care After This sheet gives you information about how to care for yourself after your procedure. Your health care provider may also give you more specific instructions. If you have problems or questions, contact your health care provider. What can I expect after the procedure? After the procedure, the following side effects are common:  Pain or discomfort at the IV site.  Nausea.  Vomiting.  Sore throat.  Trouble concentrating.  Feeling cold or chills.  Feeling weak or tired.  Sleepiness and fatigue.  Soreness and body aches. These side effects can affect parts of the body that were not involved in surgery. Follow these instructions at home: For the time period you were told by your health care provider:  Rest.  Do not participate in activities where you could fall or become injured.  Do not drive or use machinery.  Do not drink alcohol.  Do not take sleeping pills or medicines that cause drowsiness.  Do not make important decisions or sign legal documents.  Do not take care of children on your own.   Eating and drinking  Follow any instructions from your health care provider about eating or drinking restrictions.  When you feel hungry, start by eating small amounts of foods that are soft and easy to digest (bland), such as toast. Gradually return to your regular diet.  Drink enough fluid to keep your urine pale yellow.  If you vomit, rehydrate by drinking water, juice, or clear broth. General instructions  If you have sleep apnea, surgery and certain medicines can increase your risk for breathing problems. Follow instructions from your health care provider about wearing your sleep device: ? Anytime you are  sleeping, including during daytime naps. ? While taking prescription pain medicines, sleeping medicines, or medicines that make you drowsy.  Have a responsible adult stay with you for the time you are told. It is important to have someone help care for you until you are awake and alert.  Return to your normal activities as told by your health care provider. Ask your health care provider what activities are safe for you.  Take over-the-counter and prescription medicines only as told by your health care provider.  If you smoke, do not smoke without supervision.  Keep all follow-up visits as told by your health care provider. This is important. Contact a health care provider if:  You have nausea or vomiting that does not get better with medicine.  You cannot eat or drink without vomiting.  You have pain that does not get better with medicine.  You are unable to pass urine.  You develop a skin rash.  You have a fever.  You have redness around your IV site that gets worse. Get help right away if:  You have difficulty breathing.  You have chest pain.  You have blood in your urine or stool, or you vomit blood. Summary  After the procedure, it is common to have a sore throat or nausea. It is also common to feel tired.  Have a responsible adult stay with you for the time you are told. It is important to have someone help care for you until you are awake and alert.  When you feel hungry, start by eating small amounts of foods that are soft and easy to digest (bland), such as toast. Gradually return to your regular diet.  Drink enough fluid to keep your urine pale yellow.  Return to your normal activities as told by your health care provider. Ask your health care provider what activities are safe for you. This information is not intended to replace advice given to you by your health care provider. Make sure you discuss any questions you have with your health care provider. Document  Revised: 04/17/2020 Document Reviewed: 11/15/2019 Elsevier Patient Education  2021 [employer].

## 2020-12-30 NOTE — Anesthesia Procedure Notes (Signed)
Procedure Name: LMA Insertion Date/Time: 12/30/2020 11:31 AM Performed by: Mayme Genta, CRNA Pre-anesthesia Checklist: Patient identified, Emergency Drugs available, Suction available, Timeout performed and Patient being monitored Patient Re-evaluated:Patient Re-evaluated prior to induction Oxygen Delivery Method: Circle system utilized Preoxygenation: Pre-oxygenation with 100% oxygen Induction Type: IV induction LMA: LMA inserted LMA Size: 4.0 Number of attempts: 1 Placement Confirmation: positive ETCO2 and breath sounds checked- equal and bilateral Tube secured with: Tape

## 2020-12-30 NOTE — Anesthesia Preprocedure Evaluation (Signed)
Anesthesia Evaluation  Patient identified by MRN, date of birth, ID band Patient awake    Reviewed: Allergy & Precautions, H&P , NPO status , Patient's Chart, lab work & pertinent test results, reviewed documented beta blocker date and time   Airway Mallampati: II  TM Distance: >3 FB Neck ROM: full    Dental no notable dental hx.    Pulmonary neg pulmonary ROS,    Pulmonary exam normal breath sounds clear to auscultation       Cardiovascular Exercise Tolerance: Good hypertension, Normal cardiovascular exam Rhythm:regular Rate:Normal     Neuro/Psych negative neurological ROS  negative psych ROS   GI/Hepatic negative GI ROS, Neg liver ROS,   Endo/Other  diabetes, Type 2  Renal/GU negative Renal ROS  negative genitourinary   Musculoskeletal   Abdominal   Peds  Hematology negative hematology ROS (+)   Anesthesia Other Findings   Reproductive/Obstetrics negative OB ROS                             Anesthesia Physical Anesthesia Plan  ASA: II  Anesthesia Plan: General   Post-op Pain Management:    Induction:   PONV Risk Score and Plan: Ondansetron and Dexamethasone  Airway Management Planned:   Additional Equipment:   Intra-op Plan:   Post-operative Plan:   Informed Consent: I have reviewed the patients History and Physical, chart, labs and discussed the procedure including the risks, benefits and alternatives for the proposed anesthesia with the patient or authorized representative who has indicated his/her understanding and acceptance.     Dental Advisory Given  Plan Discussed with: CRNA and Anesthesiologist  Anesthesia Plan Comments:         Anesthesia Quick Evaluation

## 2020-12-30 NOTE — H&P (Signed)
Paper H&P to be scanned into permanent record. H&P reviewed. No significant changes noted.

## 2020-12-31 ENCOUNTER — Encounter: Payer: Self-pay | Admitting: Orthopedic Surgery

## 2021-06-04 ENCOUNTER — Other Ambulatory Visit: Payer: Self-pay | Admitting: Internal Medicine

## 2021-06-04 DIAGNOSIS — Z139 Encounter for screening, unspecified: Secondary | ICD-10-CM

## 2021-06-12 ENCOUNTER — Ambulatory Visit
Admission: RE | Admit: 2021-06-12 | Discharge: 2021-06-12 | Disposition: A | Discharge status: Home / Self Care | Payer: BC Managed Care – PPO | Source: Ambulatory Visit | Attending: Internal Medicine | Admitting: Internal Medicine

## 2021-06-12 ENCOUNTER — Other Ambulatory Visit: Payer: Self-pay

## 2021-06-12 DIAGNOSIS — Z139 Encounter for screening, unspecified: Secondary | ICD-10-CM

## 2021-08-12 ENCOUNTER — Other Ambulatory Visit: Payer: Self-pay | Admitting: Orthopedic Surgery

## 2021-08-12 DIAGNOSIS — M25561 Pain in right knee: Secondary | ICD-10-CM

## 2021-12-11 ENCOUNTER — Other Ambulatory Visit: Payer: Self-pay | Admitting: Internal Medicine

## 2021-12-11 DIAGNOSIS — G8929 Other chronic pain: Secondary | ICD-10-CM

## 2021-12-11 DIAGNOSIS — M25561 Pain in right knee: Secondary | ICD-10-CM

## 2021-12-28 ENCOUNTER — Ambulatory Visit
Admission: RE | Admit: 2021-12-28 | Discharge: 2021-12-28 | Disposition: A | Discharge status: Home / Self Care | Payer: BC Managed Care – PPO | Source: Ambulatory Visit | Attending: Internal Medicine | Admitting: Internal Medicine

## 2021-12-28 DIAGNOSIS — G8929 Other chronic pain: Secondary | ICD-10-CM

## 2021-12-28 DIAGNOSIS — M25561 Pain in right knee: Secondary | ICD-10-CM

## 2022-01-22 LAB — COLOGUARD: COLOGUARD: NEGATIVE

## 2022-01-22 LAB — EXTERNAL GENERIC LAB PROCEDURE: COLOGUARD: NEGATIVE

## 2022-05-14 ENCOUNTER — Other Ambulatory Visit: Payer: Self-pay | Admitting: Internal Medicine

## 2022-05-14 DIAGNOSIS — Z1231 Encounter for screening mammogram for malignant neoplasm of breast: Secondary | ICD-10-CM

## 2022-05-14 IMAGING — MR MR KNEE*L* W/O CM
6 series · 40 of 40 positions shown · non-contrast
Comparison: None.

CLINICAL DATA: No known injury, medial left knee pain with swelling

EXAM:
MRI OF THE LEFT KNEE WITHOUT CONTRAST
TECHNIQUE: Multiplanar, multisequence MR imaging of the knee was performed. No
intravenous contrast was administered.

[Series 16: T2 fat-sat · axial · left · 4.0mm · 0.50mm/px · z∈[-117,+6]mm · 4 of 26 slices shown (1 of 3)]
[im 1/26]
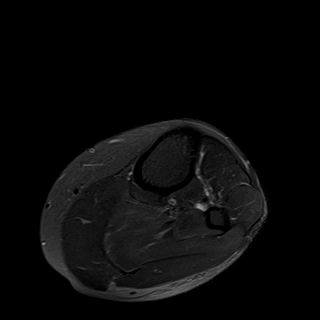
[im 9/26]
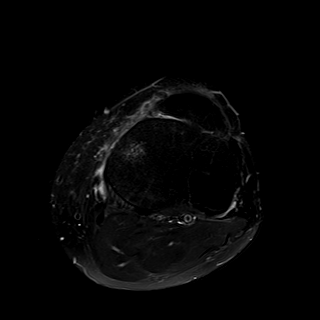
[im 17/26]
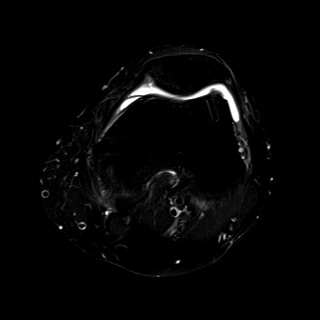
[im 26/26]
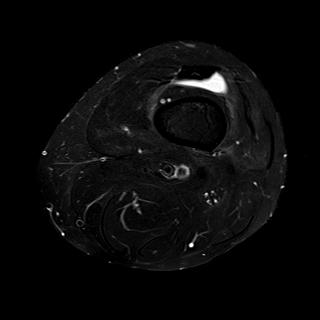

[Series 17: T1 · coronal · left · 4.0mm · 0.39mm/px · 7 of 32 slices shown]
[im 1/32]
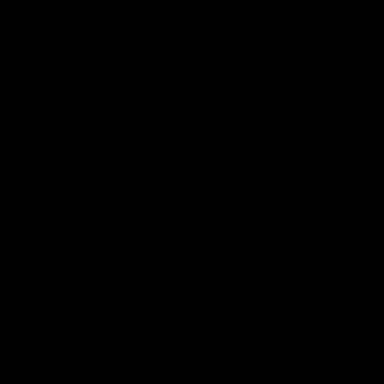
[im 6/32]
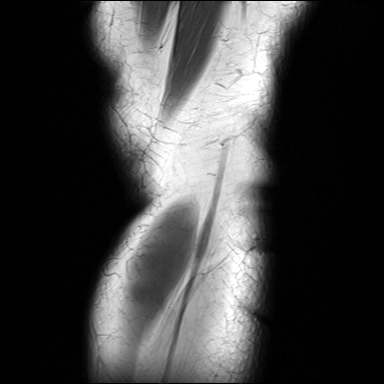
[im 11/32]
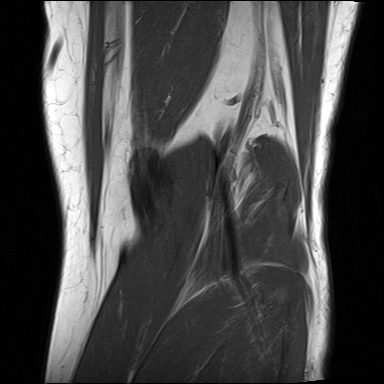
[im 16/32]
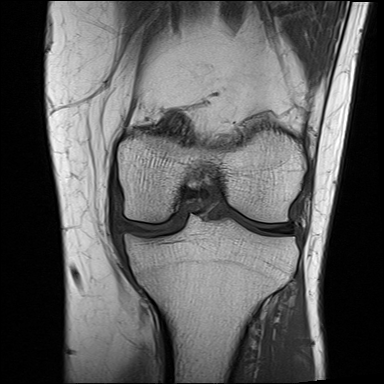
[im 21/32]
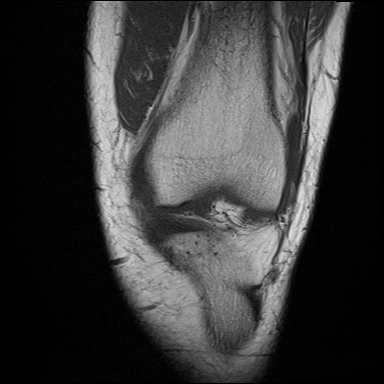
[im 26/32]
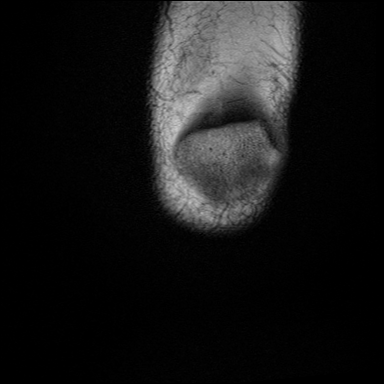
[im 32/32]
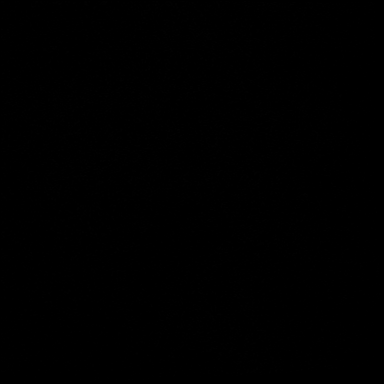

[Series 18: T2 fat-sat · coronal · left · 4.0mm · 0.59mm/px · 7 of 31 slices shown (2 of 3)]
[im 1/31]
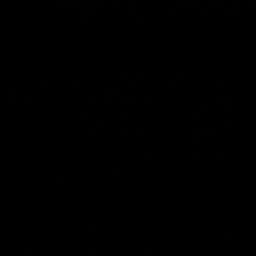
[im 6/31]
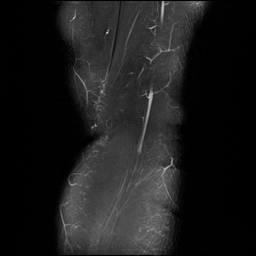
[im 11/31]
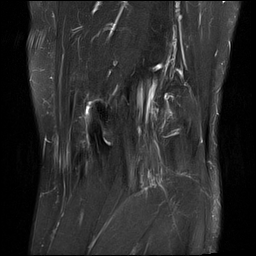
[im 16/31]
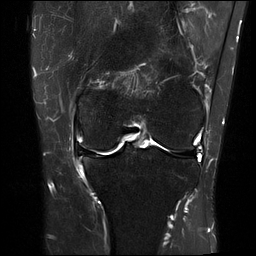
[im 21/31]
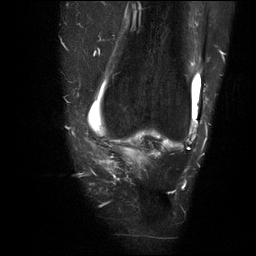
[im 26/31]
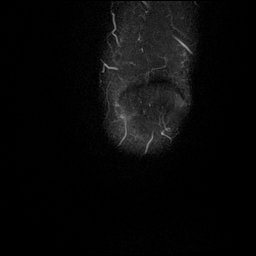
[im 31/31]
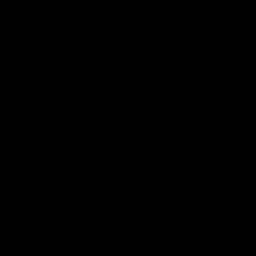

[Series 19: PD fat-sat · coronal · left · 4.0mm · 0.59mm/px · 7 of 31 slices shown (1 of 2)]
[im 1/31]
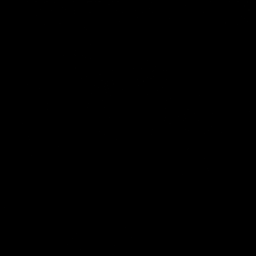
[im 6/31]
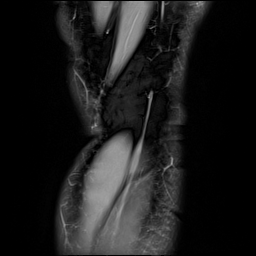
[im 11/31]
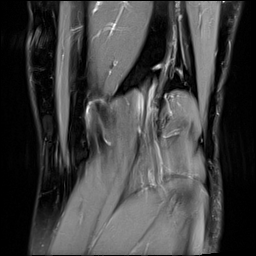
[im 16/31]
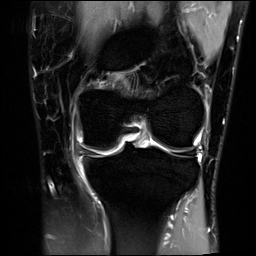
[im 21/31]
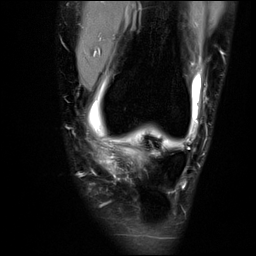
[im 26/31]
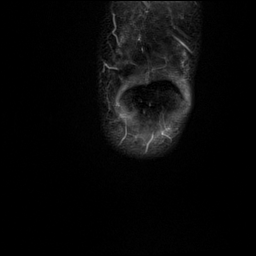
[im 31/31]
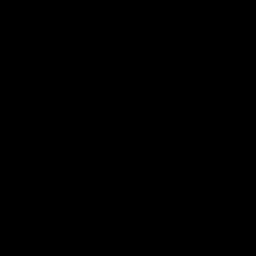

[Series 20: PD fat-sat · sagittal · left · 3.0mm · 0.59mm/px · 7 of 34 slices shown (2 of 2)]
[im 1/34]
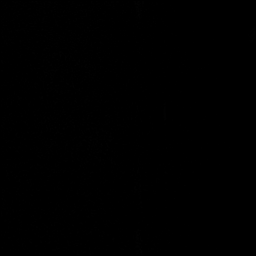
[im 6/34]
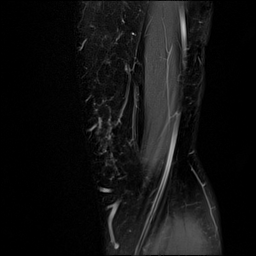
[im 12/34]
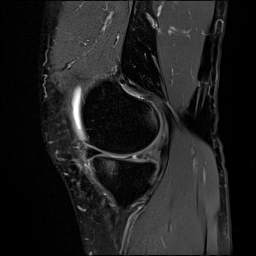
[im 17/34]
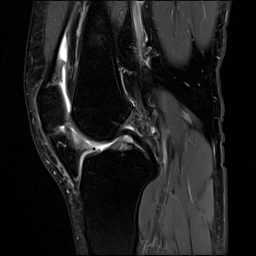
[im 23/34]
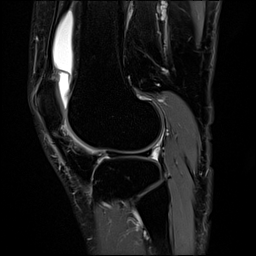
[im 28/34]
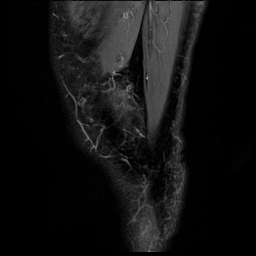
[im 34/34]
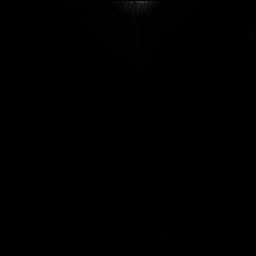

[Series 21: T2 fat-sat · sagittal · left · 3.0mm · 0.59mm/px · 8 of 36 slices shown (3 of 3)]
[im 1/36]
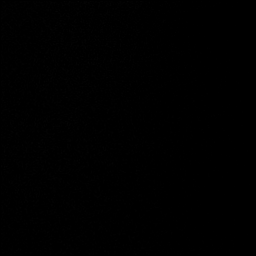
[im 6/36]
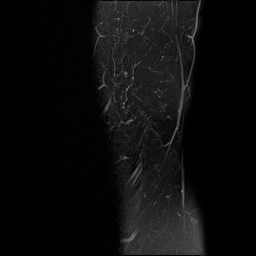
[im 11/36]
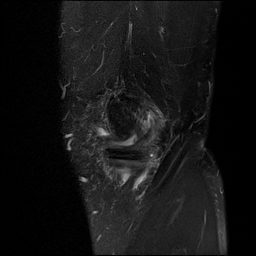
[im 16/36]
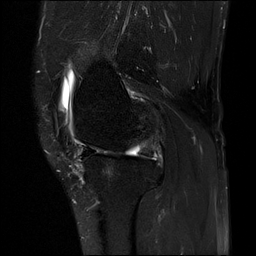
[im 21/36]
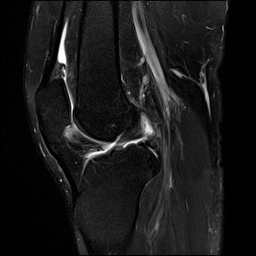
[im 26/36]
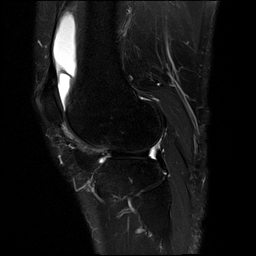
[im 31/36]
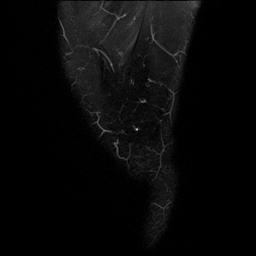
[im 36/36]
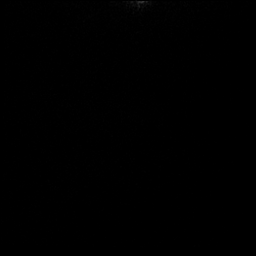

[40 of 40 positions shown; findings below may reference images not displayed]

FINDINGS: MENISCI

Medial: Radial tear of the posterior horn of the medial meniscus
towards the root with peripheral meniscal extrusion.

Lateral: Intact.

LIGAMENTS

Cruciates: ACL and PCL are intact.

Collaterals: Medial collateral ligament is intact. Lateral
collateral ligament complex is intact.

CARTILAGE

Patellofemoral: Partial-thickness cartilage loss of the lateral
patellofemoral compartment.

Medial: Partial-thickness cartilage loss of the medial femorotibial
compartment with subchondral reactive marrow edema in the medial
tibial plateau.

Lateral:  No chondral defect.

JOINT: Small joint effusion. Mild edema in the infrapatellar Hoffa's
fat. No plical thickening.

POPLITEAL FOSSA: Popliteus tendon is intact. No Baker's cyst.

EXTENSOR MECHANISM: Intact quadriceps tendon. Intact patellar
tendon. Intact lateral patellar retinaculum. Intact medial patellar
retinaculum. Intact MPFL.

BONES: No aggressive osseous lesion. No fracture or dislocation.

Other: No fluid collection or hematoma. Muscles are normal.
IMPRESSION: 1. Radial tear of the posterior horn of the medial meniscus towards
the root with peripheral meniscal extrusion.
2. Partial-thickness cartilage loss of the lateral patellofemoral
compartment.
3. Partial-thickness cartilage loss of the medial femorotibial
compartment with subchondral reactive marrow edema in the medial
tibial plateau.

## 2022-05-19 ENCOUNTER — Ambulatory Visit
Admission: RE | Admit: 2022-05-19 | Discharge: 2022-05-19 | Disposition: A | Discharge status: Home / Self Care | Payer: BC Managed Care – PPO | Source: Ambulatory Visit | Attending: Internal Medicine | Admitting: Internal Medicine

## 2022-05-19 DIAGNOSIS — Z1231 Encounter for screening mammogram for malignant neoplasm of breast: Secondary | ICD-10-CM

## 2023-05-05 ENCOUNTER — Other Ambulatory Visit: Payer: Self-pay | Admitting: Internal Medicine

## 2023-05-05 DIAGNOSIS — Z1231 Encounter for screening mammogram for malignant neoplasm of breast: Secondary | ICD-10-CM

## 2023-05-16 ENCOUNTER — Ambulatory Visit
Admission: RE | Admit: 2023-05-16 | Discharge: 2023-05-16 | Disposition: A | Discharge status: Home / Self Care | Payer: BC Managed Care – PPO | Source: Ambulatory Visit | Attending: Internal Medicine | Admitting: Internal Medicine

## 2023-05-16 DIAGNOSIS — Z1231 Encounter for screening mammogram for malignant neoplasm of breast: Secondary | ICD-10-CM

## 2024-04-12 ENCOUNTER — Other Ambulatory Visit: Payer: Self-pay | Admitting: Internal Medicine

## 2024-04-12 DIAGNOSIS — Z1231 Encounter for screening mammogram for malignant neoplasm of breast: Secondary | ICD-10-CM

## 2024-05-14 ENCOUNTER — Ambulatory Visit
Admission: RE | Admit: 2024-05-14 | Discharge: 2024-05-14 | Disposition: A | Source: Ambulatory Visit | Attending: Internal Medicine | Admitting: Internal Medicine

## 2024-05-14 DIAGNOSIS — Z1231 Encounter for screening mammogram for malignant neoplasm of breast: Secondary | ICD-10-CM
